# Patient Record
Sex: Female | Born: 1995 | Race: White | Hispanic: No | Marital: Single
Health system: Southern US, Community
[De-identification: ages and names within clinical notes are randomized; demographics above are authoritative.]

---
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
## 2013-10-28 ENCOUNTER — Ambulatory Visit
Admission: RE | Admit: 2013-10-28 | Discharge: 2013-10-28 | Disposition: A | Discharge status: Home / Self Care | Payer: Managed Care, Other (non HMO) | Source: Ambulatory Visit | Attending: Family | Admitting: Family

## 2013-10-28 ENCOUNTER — Other Ambulatory Visit: Payer: Self-pay | Admitting: Family

## 2013-10-28 DIAGNOSIS — R7611 Nonspecific reaction to tuberculin skin test without active tuberculosis: Secondary | ICD-10-CM

## 2014-02-01 IMAGING — US US SOFT TISSUE HEAD/NECK
1 series · 14 of 25 positions shown · non-contrast
Comparison: None

CLINICAL DATA: Thyroid enlargement

THYROID ULTRASOUND
TECHNIQUE: Ultrasound examination of the thyroid gland and adjacent
soft tissues was performed.

[Series 1: us soft tissue head/neck · 0.07mm/px · 14 of 41 slices shown]
[im 1/41]
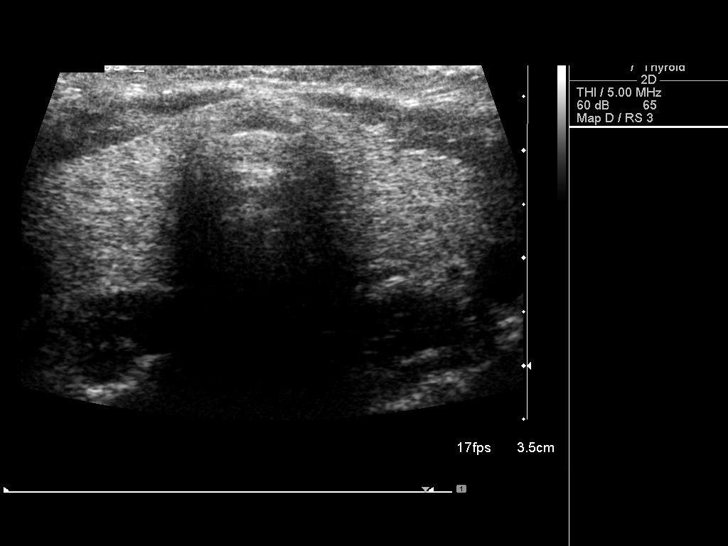
[im 4/41]
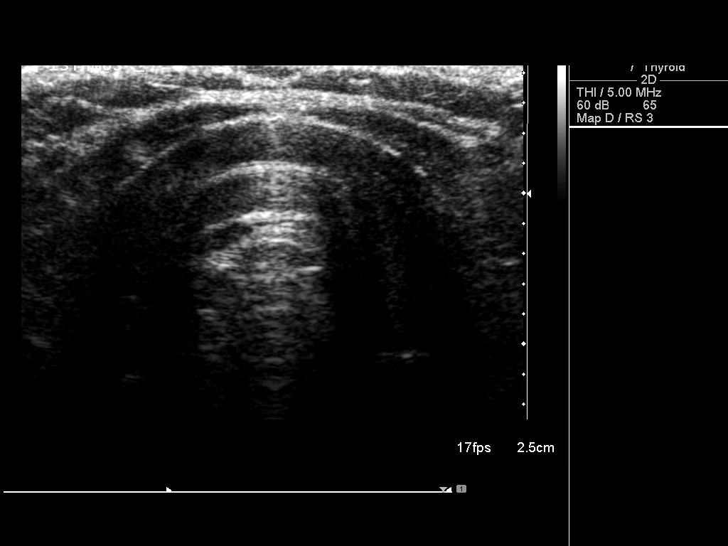
[im 7/41]
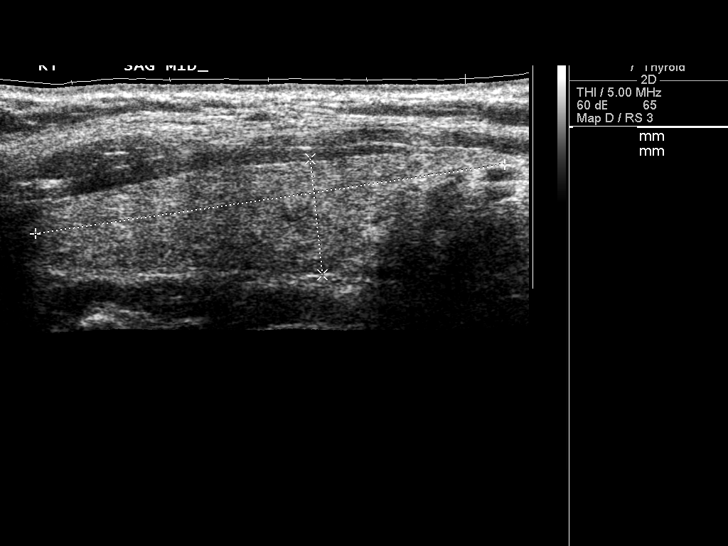
[im 11/41]
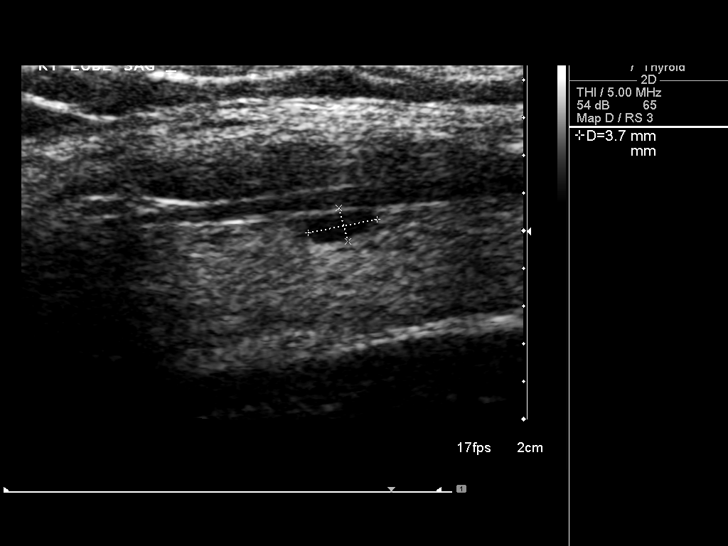
[im 14/41]
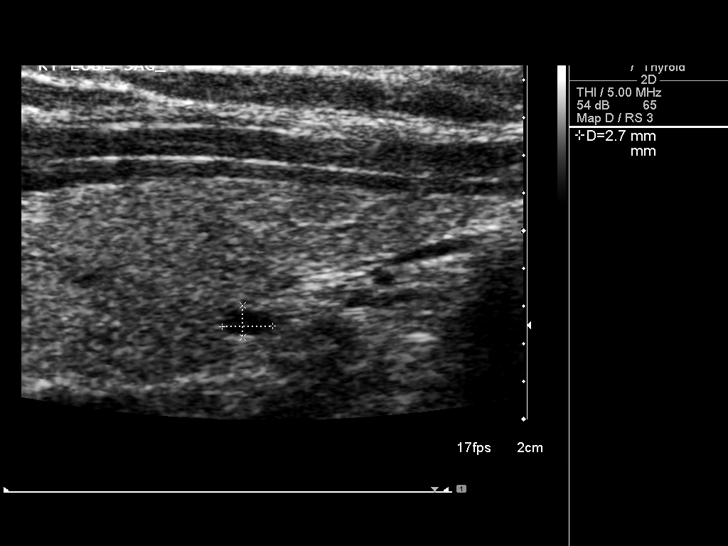
[im 16/41]
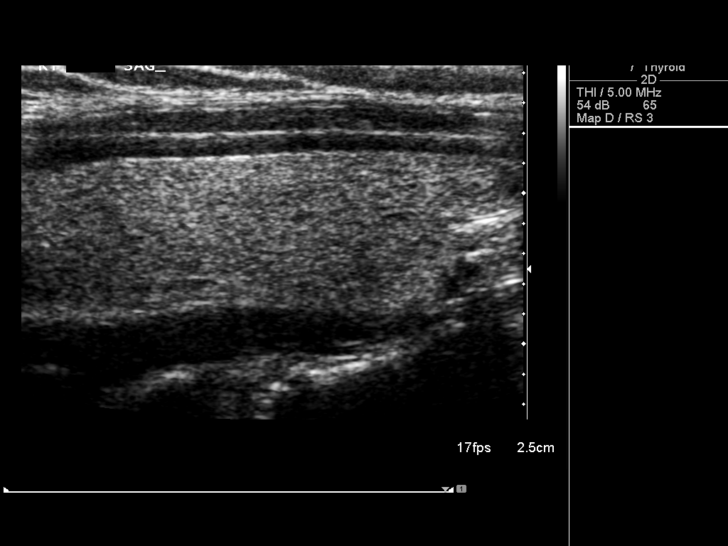
[im 19/41]
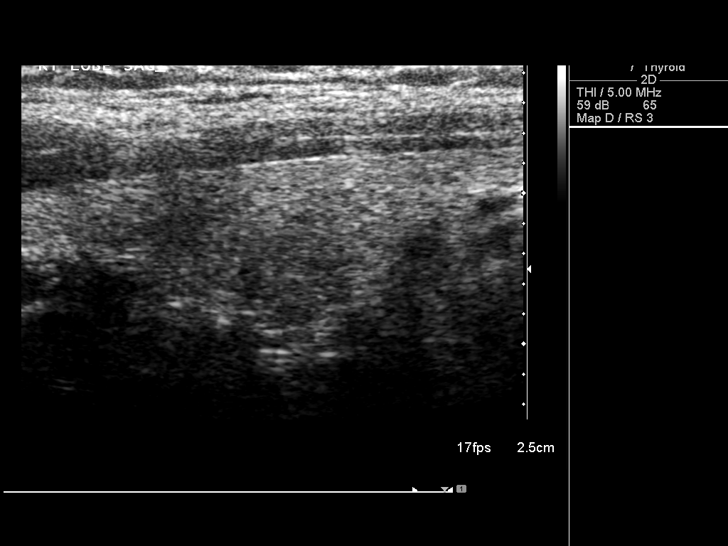
[im 22/41]
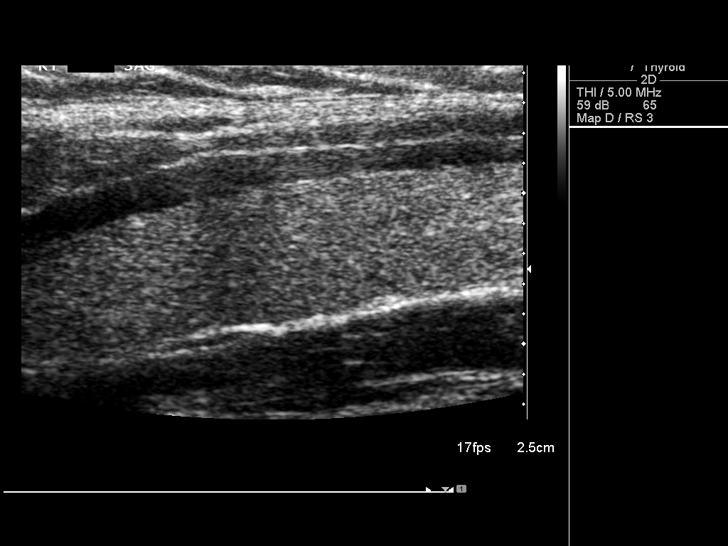
[im 26/41]
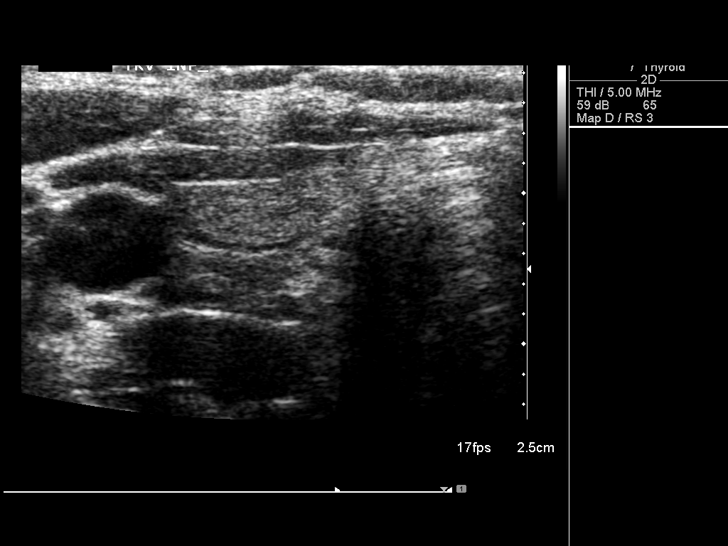
[im 27/41]
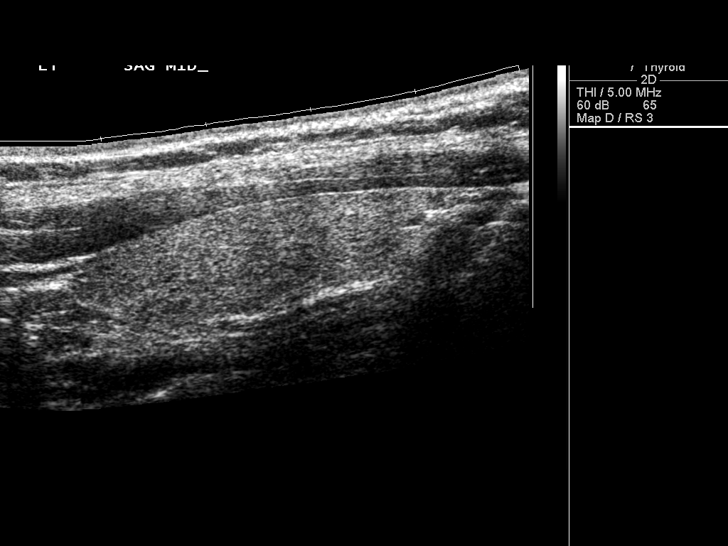
[im 31/41]
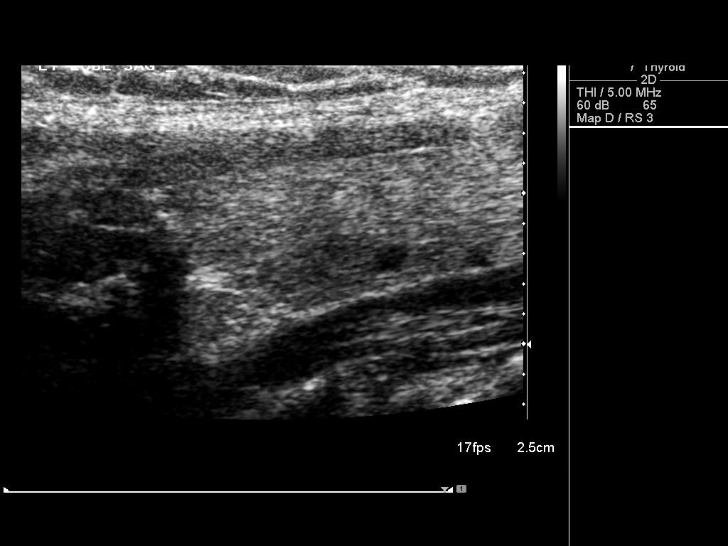
[im 34/41]
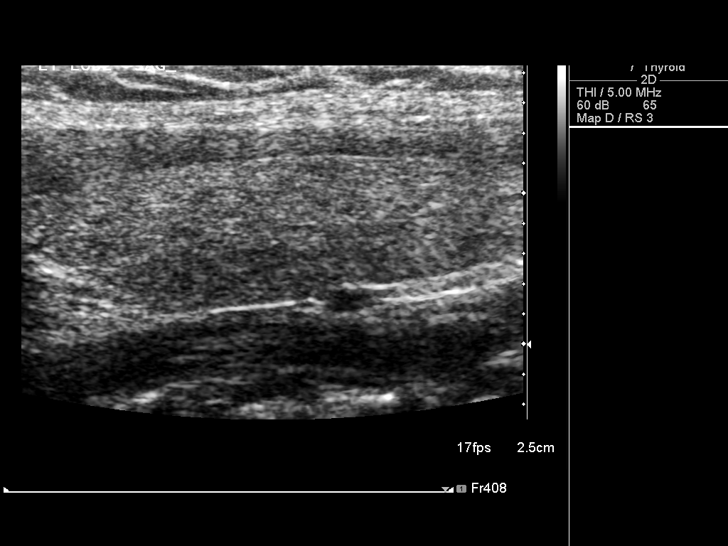
[im 37/41]
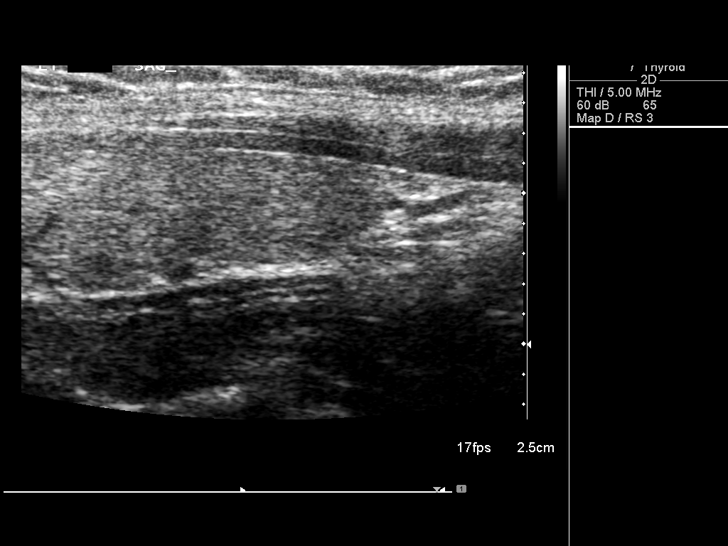
[im 41/41]
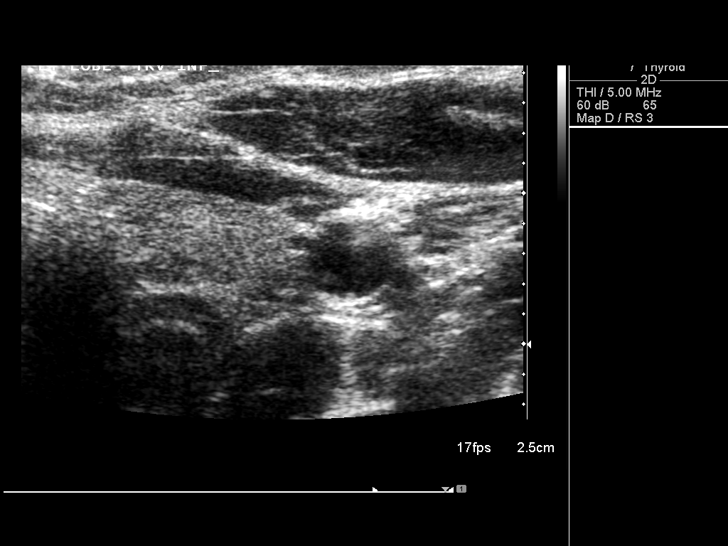

[14 of 25 positions shown; findings below may reference images not displayed]

FINDINGS: Right thyroid lobe:  4.8 x 1.2 x 1.8 cm. Mildly inhomogeneous
echogenicity
Left thyroid lobe:  3.8 x 1.1 x 1.9 cm.  Mildly inhomogeneous
echogenicity.
Isthmus:  2 mm thick

Focal nodules:  Tiny bilateral thyroid cysts are identified up to 4
mm in greatest size.  No solid masses, large cysts, or
calcifications.

Lymphadenopathy:  None visualized.
IMPRESSION: Tiny bilateral thyroid cysts.
Otherwise normal exam.

## 2014-12-09 ENCOUNTER — Other Ambulatory Visit: Payer: Self-pay | Admitting: Family Medicine

## 2014-12-09 ENCOUNTER — Other Ambulatory Visit (HOSPITAL_COMMUNITY)
Admission: RE | Admit: 2014-12-09 | Discharge: 2014-12-09 | Disposition: A | Discharge status: Home / Self Care | Payer: Managed Care, Other (non HMO) | Source: Ambulatory Visit | Attending: Family Medicine | Admitting: Family Medicine

## 2014-12-09 DIAGNOSIS — Z01419 Encounter for gynecological examination (general) (routine) without abnormal findings: Secondary | ICD-10-CM | POA: Diagnosis not present

## 2014-12-13 LAB — CYTOLOGY - PAP

## 2014-12-19 IMAGING — CR DG CHEST 2V
2 series · 2 of 2 positions shown · non-contrast
Comparison: None.

CLINICAL DATA: Positive tuberculin skin test.

EXAM:
CHEST  2 VIEW

[view not recorded (1 of 2)]
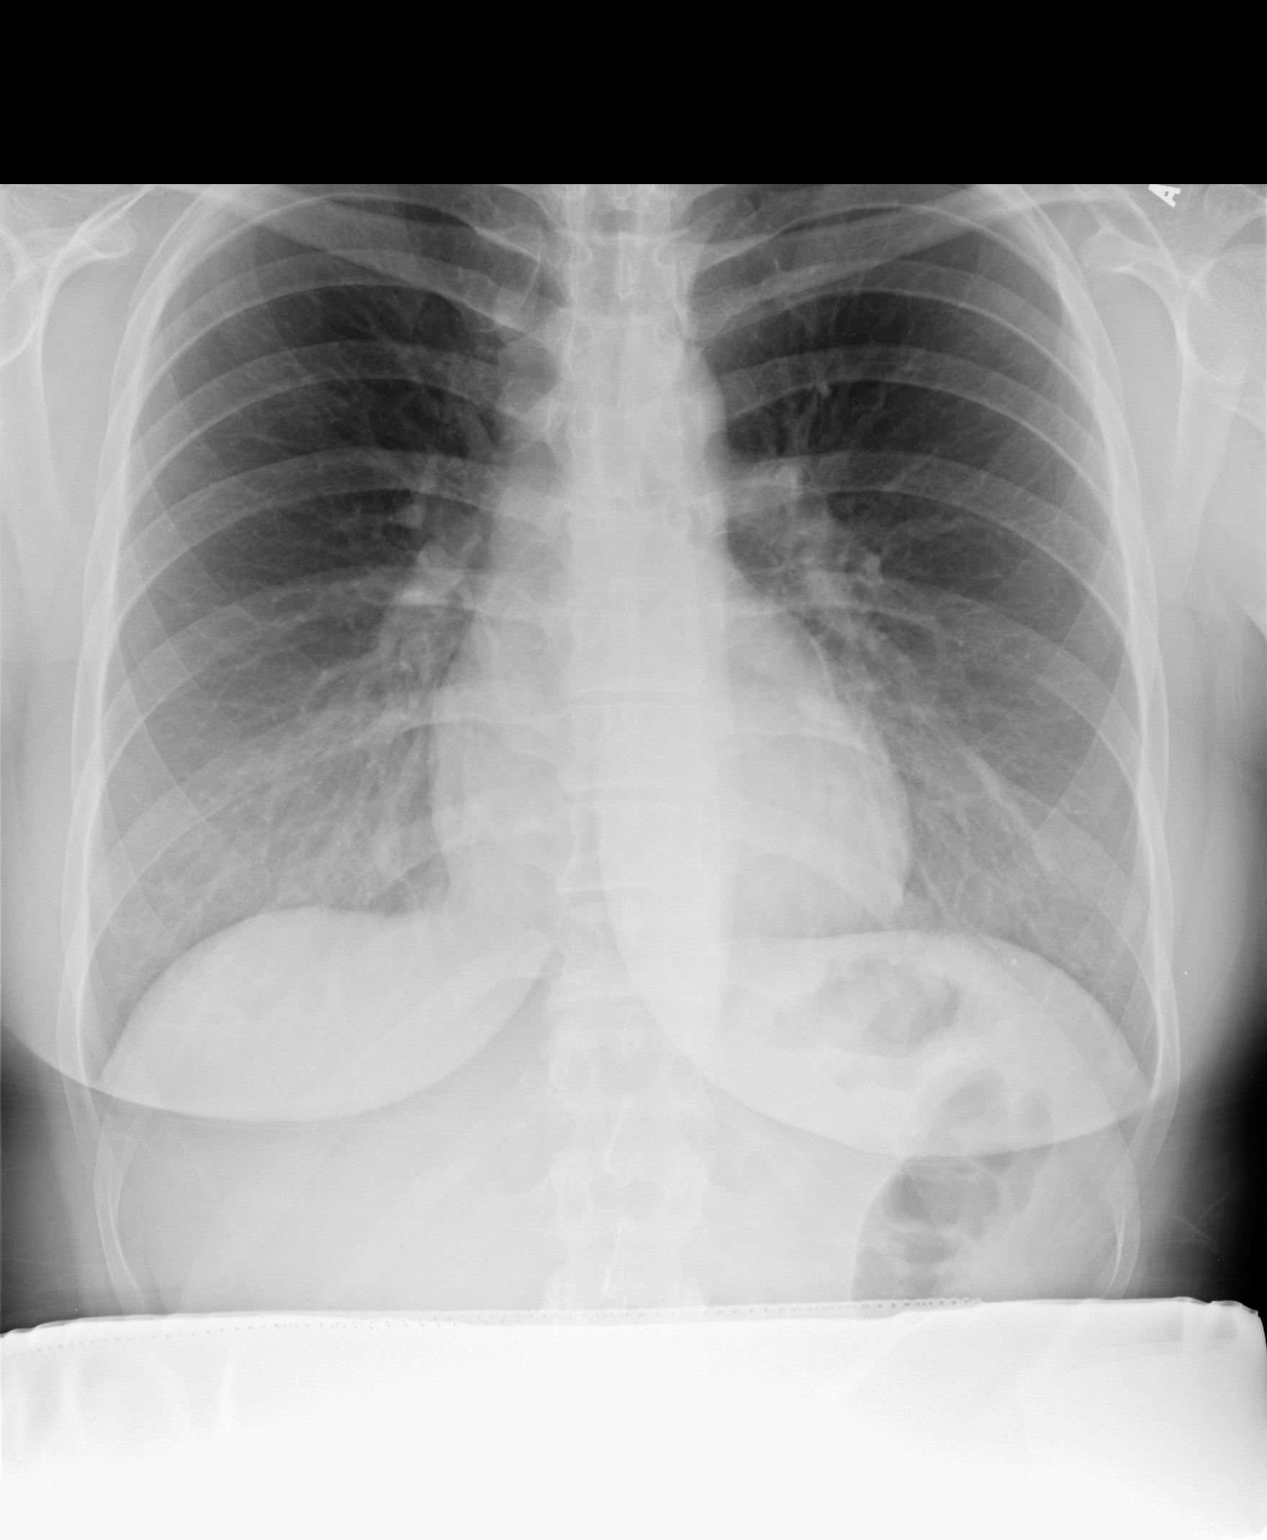

[view not recorded (2 of 2)]
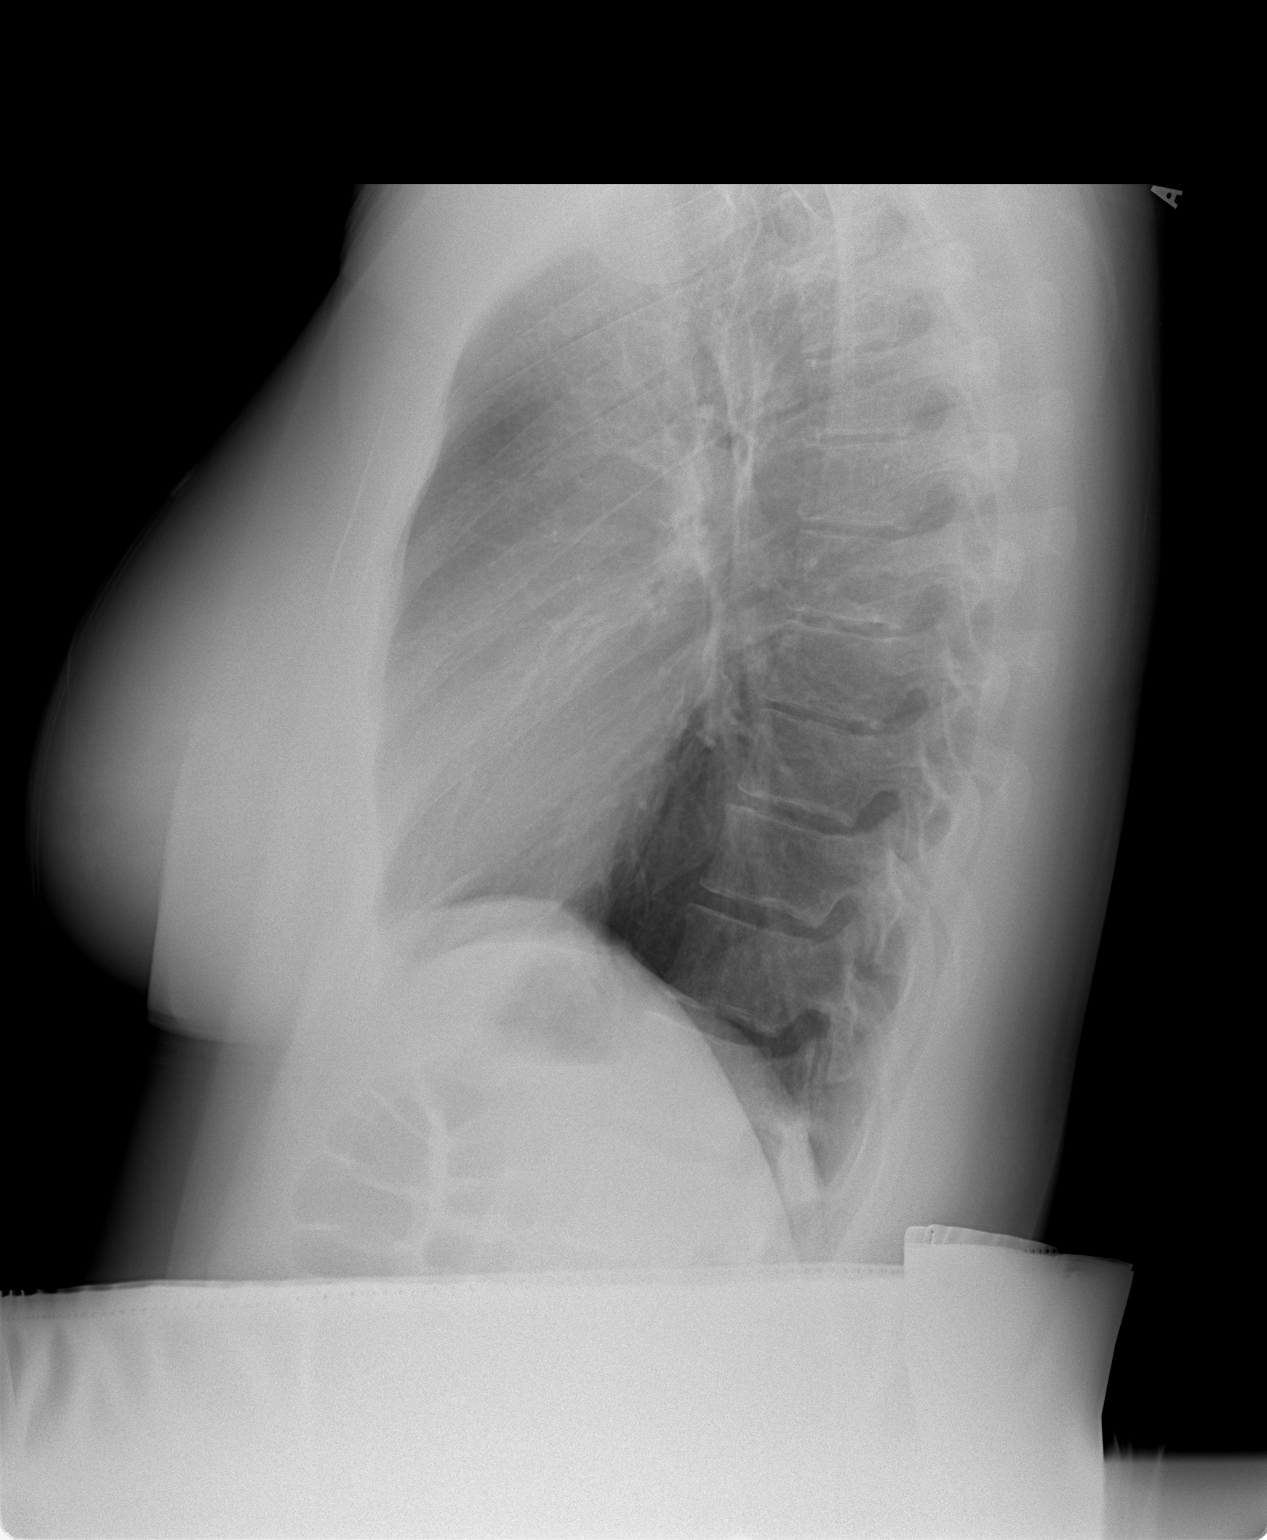

[2 of 2 positions shown; findings below may reference images not displayed]

FINDINGS: The heart size and mediastinal contours are within normal limits.
Both lungs are clear. The visualized skeletal structures are
unremarkable.
IMPRESSION: No acute cardiopulmonary abnormality seen.

## 2017-01-02 ENCOUNTER — Ambulatory Visit
Admit: 2017-01-02 | Discharge: 2017-01-02 | Payer: BLUE CROSS/BLUE SHIELD | Attending: Geriatric Medicine | Primary: Geriatric Medicine

## 2017-01-02 DIAGNOSIS — B178 Other specified acute viral hepatitis: Secondary | ICD-10-CM

## 2017-01-02 NOTE — Progress Notes (Signed)
Chief Complaint   Patient presents with   ??? Sore Throat   ??? Fatigue   ??? Head Pain       HPI  Tammy Bernard is a 21 y.o. female who presents in the office today for sore throat and fatigue for the past 3 weeks.    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  Patient's friend's boyfriend was diagnosed with mono a few weeks ago, patient did share drinks with person roughly 4 weeks ago.  Patient has never had mono previously  Patient feels very fatigued, unable to do some of the normal things that she does such as work at the [employer], exercise classes.  Patient has not measured a fever  Patient had lab work done where she works, showing elevated liver enzymes elevated LDH 47, AST is 135, ALT 308, GGT 233, mono test positive.  Patient also has discomfort in the left side of the upper abdomen when she sneezes or bends over.  Patient is training for half marathon however she does not have any history or desire to play contact sports    Patient also requesting information regarding mono, asking if it is okay if she drinks, patient drinks heavily with friends at festivals and some parties in the weekends.      History reviewed. No pertinent past medical history.    Allergies not on file    History reviewed. No pertinent surgical history.    Social History     Social History   ??? Marital status: SINGLE     Spouse name: N/A   ??? Number of children: N/A   ??? Years of education: N/A     Occupational History   ??? Not on file.     Social History Main Topics   ??? Smoking status: Never Smoker   ??? Smokeless tobacco: Never Used   ??? Alcohol use Yes      Comment: weekly   ??? Drug use: No   ??? Sexual activity: Not on file     Other Topics Concern   ??? Not on file     Social History Narrative   ??? No narrative on file         Review of Systems    Constitutional: Positive for malaise/fatigue. Negative for chills, fever and weight loss.   HENT: Positive for sore throat. Negative for congestion, ear discharge, ear pain and hearing loss.    Eyes: Negative.  Negative for blurred vision and pain.   Respiratory: Negative.    Cardiovascular: Negative.  Negative for chest pain and palpitations.   Gastrointestinal: Positive for abdominal pain. Negative for blood in stool.   Genitourinary: Negative.    Musculoskeletal: Positive for myalgias and neck pain. Negative for back pain, falls and joint pain.   Skin: Negative.    Neurological: Positive for headaches. Negative for dizziness, tremors and weakness.   Endo/Heme/Allergies: Negative.  Does not bruise/bleed easily.   Psychiatric/Behavioral: Negative.  Negative for depression. The patient is not nervous/anxious and does not have insomnia.    All other systems reviewed and are negative.        PHYSICAL EXAM   Visit Vitals   ??? BP 126/80 (BP 1 Location: Left arm, BP Patient Position: Sitting)   ??? Pulse 78   ??? Temp 98 ??F (36.7 ??C)   ???  Ht 5' 3.5" (1.613 m)   ??? Wt 167 lb (75.8 kg)   ??? SpO2 98%   ??? BMI 29.12 kg/m2        Physical Exam   Constitutional: She is oriented to person, place, and time. She appears well-developed.   HENT:   Head: Normocephalic and atraumatic.   Right Ear: External ear normal.   Left Ear: External ear normal.   Nose: Nose normal.   Mouth/Throat: Oropharyngeal exudate present.   Eyes: Pupils are equal, round, and reactive to light.   Neck: Normal range of motion. Neck supple. No JVD present. No tracheal deviation present. No thyromegaly present.       Cardiovascular: Normal rate, regular rhythm and normal heart sounds.  Exam reveals no gallop and no friction rub.    No murmur heard.  Pulmonary/Chest: Effort normal and breath sounds normal. No stridor. No respiratory distress. She has no wheezes. She has no rales. She exhibits no tenderness.    Abdominal: Soft. Bowel sounds are normal. She exhibits no distension and no mass. There is tenderness. There is no rebound and no guarding.   Tenderness left upper quadrant, spleen feels enlarged   Musculoskeletal: Normal range of motion. She exhibits no edema, tenderness or deformity.   Lymphadenopathy:     She has cervical adenopathy.   Neurological: She is alert and oriented to person, place, and time.   Skin: Skin is warm and dry. She is not diaphoretic.   Psychiatric: She has a normal mood and affect. Her behavior is normal. Judgment and thought content normal.            No results found for this or any previous visit.    Reviewed previous labs, reviewed chart notes and consults related to mononucleosis diagnosis, labs done elsewhere    ASSESSMENT AND PLAN      ICD-10-CM ICD-9-CM    1. Infectious mononucleosis hepatitis B27.99 075     B17.8 573.1    2. Mononucleosis B27.90 075    3. Spleen enlarged R16.1 789.2        Patient is a 21 year old female with history of 15 drinks per week, presenting with fatigue, low-grade fevers not measured, lymphadenopathy in her neck, sore throat for the past 3 weeks.  Mononucleosis blood test positive, patient also has elevated LFTs, and large spleen on exam  Suspect mono, discussed avoiding alcohol until LFTs and resolved  Patient to return in 3 months for LFT recheck  No contact sports  Discussed conservative measures of treatment, rest, Tylenol or ibuprofen for discomfort      Part or all of the above note was dictated using Dragon software/dictation.  Please excuse occasional typos/substitution with incorrect words.    Frankey ShownSarah E Eulamae Greenstein, MD

## 2017-01-03 ENCOUNTER — Encounter: Attending: Family | Primary: Geriatric Medicine

## 2017-04-04 ENCOUNTER — Encounter: Attending: Geriatric Medicine | Primary: Geriatric Medicine
